# Patient Record
Sex: Male | Born: 1940 | Race: White | Hispanic: No | Smoking: Never smoker
Health system: Southern US, Community
[De-identification: ages and names within clinical notes are randomized; demographics above are authoritative.]

## PROBLEM LIST (undated history)

## (undated) DIAGNOSIS — I10 Essential (primary) hypertension: Secondary | ICD-10-CM

## (undated) HISTORY — PX: KNEE ARTHROSCOPY: SUR90

---

## 2002-06-12 ENCOUNTER — Emergency Department (HOSPITAL_COMMUNITY): Admission: EM | Admit: 2002-06-12 | Discharge: 2002-06-12 | Payer: Self-pay | Admitting: Emergency Medicine

## 2002-06-12 ENCOUNTER — Encounter: Payer: Self-pay | Admitting: Emergency Medicine

## 2002-07-19 ENCOUNTER — Inpatient Hospital Stay (HOSPITAL_COMMUNITY): Admission: RE | Admit: 2002-07-19 | Discharge: 2002-07-21 | Payer: Self-pay | Admitting: Internal Medicine

## 2002-07-20 ENCOUNTER — Encounter (INDEPENDENT_AMBULATORY_CARE_PROVIDER_SITE_OTHER): Payer: Self-pay | Admitting: Specialist

## 2002-07-21 ENCOUNTER — Encounter: Payer: Self-pay | Admitting: Internal Medicine

## 2005-04-25 ENCOUNTER — Emergency Department (HOSPITAL_COMMUNITY): Admission: EM | Admit: 2005-04-25 | Discharge: 2005-04-25 | Payer: Self-pay | Admitting: Emergency Medicine

## 2005-12-07 ENCOUNTER — Emergency Department (HOSPITAL_COMMUNITY): Admission: EM | Admit: 2005-12-07 | Discharge: 2005-12-07 | Payer: Self-pay | Admitting: Emergency Medicine

## 2006-06-12 ENCOUNTER — Emergency Department (HOSPITAL_COMMUNITY): Admission: EM | Admit: 2006-06-12 | Discharge: 2006-06-12 | Payer: Self-pay | Admitting: Emergency Medicine

## 2006-11-11 ENCOUNTER — Emergency Department (HOSPITAL_COMMUNITY): Admission: EM | Admit: 2006-11-11 | Discharge: 2006-11-11 | Payer: Self-pay | Admitting: Emergency Medicine

## 2006-11-16 ENCOUNTER — Ambulatory Visit (HOSPITAL_COMMUNITY): Admission: RE | Admit: 2006-11-16 | Discharge: 2006-11-16 | Payer: Self-pay | Admitting: Orthopaedic Surgery

## 2006-11-25 ENCOUNTER — Encounter (INDEPENDENT_AMBULATORY_CARE_PROVIDER_SITE_OTHER): Payer: Self-pay | Admitting: Specialist

## 2006-11-25 ENCOUNTER — Ambulatory Visit (HOSPITAL_COMMUNITY): Admission: RE | Admit: 2006-11-25 | Discharge: 2006-11-25 | Payer: Self-pay | Admitting: Orthopaedic Surgery

## 2007-11-09 ENCOUNTER — Emergency Department (HOSPITAL_COMMUNITY): Admission: EM | Admit: 2007-11-09 | Discharge: 2007-11-09 | Payer: Self-pay | Admitting: Emergency Medicine

## 2009-03-22 ENCOUNTER — Emergency Department (HOSPITAL_COMMUNITY): Admission: EM | Admit: 2009-03-22 | Discharge: 2009-03-22 | Payer: Self-pay | Admitting: Emergency Medicine

## 2009-08-24 ENCOUNTER — Emergency Department (HOSPITAL_COMMUNITY): Admission: EM | Admit: 2009-08-24 | Discharge: 2009-08-24 | Payer: Self-pay | Admitting: Emergency Medicine

## 2009-09-27 ENCOUNTER — Emergency Department (HOSPITAL_COMMUNITY): Admission: EM | Admit: 2009-09-27 | Discharge: 2009-09-27 | Payer: Self-pay | Admitting: Emergency Medicine

## 2009-10-10 ENCOUNTER — Emergency Department (HOSPITAL_COMMUNITY): Admission: EM | Admit: 2009-10-10 | Discharge: 2009-10-11 | Payer: Self-pay | Admitting: Emergency Medicine

## 2010-01-08 ENCOUNTER — Emergency Department (HOSPITAL_COMMUNITY): Admission: EM | Admit: 2010-01-08 | Discharge: 2010-01-08 | Payer: Self-pay | Admitting: Emergency Medicine

## 2010-09-07 ENCOUNTER — Encounter: Payer: Self-pay | Admitting: Orthopaedic Surgery

## 2010-10-12 ENCOUNTER — Emergency Department (HOSPITAL_COMMUNITY): Payer: Medicare (Managed Care)

## 2010-10-12 ENCOUNTER — Emergency Department (HOSPITAL_COMMUNITY)
Admission: EM | Admit: 2010-10-12 | Discharge: 2010-10-12 | Disposition: A | Discharge status: Home / Self Care | Payer: Medicare (Managed Care) | Attending: Emergency Medicine | Admitting: Emergency Medicine

## 2010-10-12 DIAGNOSIS — M25619 Stiffness of unspecified shoulder, not elsewhere classified: Secondary | ICD-10-CM | POA: Insufficient documentation

## 2010-10-12 DIAGNOSIS — M25519 Pain in unspecified shoulder: Secondary | ICD-10-CM | POA: Insufficient documentation

## 2010-10-12 DIAGNOSIS — Z79899 Other long term (current) drug therapy: Secondary | ICD-10-CM | POA: Insufficient documentation

## 2010-10-12 DIAGNOSIS — M19019 Primary osteoarthritis, unspecified shoulder: Secondary | ICD-10-CM | POA: Insufficient documentation

## 2010-10-12 DIAGNOSIS — I10 Essential (primary) hypertension: Secondary | ICD-10-CM | POA: Insufficient documentation

## 2010-10-12 LAB — BASIC METABOLIC PANEL
BUN: 22 mg/dL (ref 6–23)
CO2: 27 mEq/L (ref 19–32)
Chloride: 103 mEq/L (ref 96–112)
Creatinine, Ser: 1.62 mg/dL — ABNORMAL HIGH (ref 0.4–1.5)
Glucose, Bld: 96 mg/dL (ref 70–99)
Potassium: 3.8 mEq/L (ref 3.5–5.1)

## 2010-10-28 ENCOUNTER — Other Ambulatory Visit (HOSPITAL_COMMUNITY): Payer: Self-pay | Admitting: Internal Medicine

## 2010-10-28 DIAGNOSIS — M1990 Unspecified osteoarthritis, unspecified site: Secondary | ICD-10-CM

## 2010-10-30 ENCOUNTER — Other Ambulatory Visit (HOSPITAL_COMMUNITY): Payer: Medicare (Managed Care)

## 2010-11-05 LAB — DIFFERENTIAL
Basophils Absolute: 0 10*3/uL (ref 0.0–0.1)
Basophils Relative: 1 % (ref 0–1)
Lymphocytes Relative: 32 % (ref 12–46)
Monocytes Relative: 7 % (ref 3–12)
Neutro Abs: 2.1 10*3/uL (ref 1.7–7.7)
Neutrophils Relative %: 55 % (ref 43–77)

## 2010-11-05 LAB — CBC
HCT: 34.1 % — ABNORMAL LOW (ref 39.0–52.0)
Hemoglobin: 11.8 g/dL — ABNORMAL LOW (ref 13.0–17.0)
MCHC: 34.6 g/dL (ref 30.0–36.0)
MCV: 91.8 fL (ref 78.0–100.0)
RDW: 14 % (ref 11.5–15.5)

## 2010-11-05 LAB — COMPREHENSIVE METABOLIC PANEL
Alkaline Phosphatase: 38 U/L — ABNORMAL LOW (ref 39–117)
BUN: 15 mg/dL (ref 6–23)
Creatinine, Ser: 1.26 mg/dL (ref 0.4–1.5)
Glucose, Bld: 96 mg/dL (ref 70–99)
Potassium: 3.2 mEq/L — ABNORMAL LOW (ref 3.5–5.1)
Total Protein: 6.4 g/dL (ref 6.0–8.3)

## 2010-11-05 LAB — URINALYSIS, ROUTINE W REFLEX MICROSCOPIC
Bilirubin Urine: NEGATIVE
Glucose, UA: NEGATIVE mg/dL
Hgb urine dipstick: NEGATIVE
Ketones, ur: NEGATIVE mg/dL
Protein, ur: NEGATIVE mg/dL
pH: 5.5 (ref 5.0–8.0)

## 2010-11-10 ENCOUNTER — Ambulatory Visit (HOSPITAL_COMMUNITY)
Admission: RE | Admit: 2010-11-10 | Discharge: 2010-11-10 | Disposition: A | Discharge status: Home / Self Care | Payer: Medicare (Managed Care) | Source: Ambulatory Visit | Attending: Internal Medicine | Admitting: Internal Medicine

## 2010-11-10 ENCOUNTER — Encounter (HOSPITAL_COMMUNITY): Payer: Self-pay

## 2010-11-10 DIAGNOSIS — M899 Disorder of bone, unspecified: Secondary | ICD-10-CM | POA: Insufficient documentation

## 2010-11-10 DIAGNOSIS — M1990 Unspecified osteoarthritis, unspecified site: Secondary | ICD-10-CM

## 2010-11-19 ENCOUNTER — Other Ambulatory Visit (HOSPITAL_COMMUNITY): Payer: Self-pay | Admitting: Nephrology

## 2010-11-19 DIAGNOSIS — N289 Disorder of kidney and ureter, unspecified: Secondary | ICD-10-CM

## 2010-11-27 ENCOUNTER — Ambulatory Visit (HOSPITAL_COMMUNITY): Payer: Medicare (Managed Care)

## 2010-12-01 ENCOUNTER — Ambulatory Visit (HOSPITAL_COMMUNITY)
Admission: RE | Admit: 2010-12-01 | Discharge: 2010-12-01 | Disposition: A | Discharge status: Home / Self Care | Payer: Medicare (Managed Care) | Source: Ambulatory Visit | Attending: Nephrology | Admitting: Nephrology

## 2010-12-01 DIAGNOSIS — N289 Disorder of kidney and ureter, unspecified: Secondary | ICD-10-CM | POA: Insufficient documentation

## 2010-12-01 DIAGNOSIS — R9389 Abnormal findings on diagnostic imaging of other specified body structures: Secondary | ICD-10-CM | POA: Insufficient documentation

## 2010-12-01 DIAGNOSIS — I10 Essential (primary) hypertension: Secondary | ICD-10-CM | POA: Insufficient documentation

## 2011-01-02 NOTE — Op Note (Signed)
[name], [name]                         ACCOUNT NO.:  [id number]   MEDICAL RECORD NO.:  [id number]                   PATIENT TYPE:  INP   LOCATION:  5714                                 FACILITY:  [facility]   PHYSICIAN:  Petra Kuba, M.D.                 DATE OF BIRTH:  1941/03/27   DATE OF PROCEDURE:  07/20/2002  DATE OF DISCHARGE:                                 OPERATIVE REPORT   PROCEDURE PERFORMED:  Esophagogastroduodenoscopy with biopsy.   ENDOSCOPIST:  Petra Kuba, M.D.   INDICATIONS FOR PROCEDURE:  Gastrointestinal bleeding in a patient who is an  alcoholic on aspirin and nonsteroidals.  Consent was signed after the risks,  benefits, methods and options were thoroughly discussed with the patient.   MEDICINES USED:  Demerol 50 mg, Versed 5 mg.   DESCRIPTION OF PROCEDURE:  The video endoscope was inserted by direct  vision.  The esophagus was normal.  There were no obvious signs of varices  or  hiatal hernia or esophagitis.  Scope passed into the stomach and  advanced through a normal antrum and normal pylorus into a normal duodenal  bulb and around the C-loop to a normal second portion of the duodenum.  No  blood was seen so the scope was slowly withdrawn back to the bulb and a good  look there ruled out masses or abnormalities in that location.  Scope was  withdrawn back to the stomach and retroflexed.  The angularis, cardia,  fundus, lesser and greater curve were normal except for some moderate  gastritis.  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  The scope was  straightened.  Straight visualization in the stomach confirmed the above  findings without additional findings.  Two biopsies of the antrum and two of  the proximal stomach were obtained to confirm the gastropathy and rule out  Helicobacter pylori or other causes of his gastritis.  Air was suctioned,  scope was slowly withdrawn.  Again, a good look at the esophagus on slow  withdrawal was normal.  The  patient tolerated the procedure well.  There  were no obvious immediate complication.   ENDOSCOPIC DIAGNOSIS:  1. Gastropathy versus moderate gastritis status post biopsy.  2. No blood seen.  3. Otherwise normal esophagogastroduodenoscopy.   PLAN:  Will need a colon check at some point.  I have checked the schedule  for tomorrow but will have to do as an outpatient or early next week if  still here due to scheduling problems tomorrow.  We could go ahead and get  an ultrasound of his abdomen and his kidneys tomorrow.  No aspirin or  nonsteroidals, probably long term.  Up to four  Tylenol a day is okay with liver disease.  Will go ahead and put him on pump  inhibitors and slowly advance his diet.  I have discussed all of the above  with the wife.  She  will call me next week if he is discharged, for the  biopsy results and to set up a colon depending on what his BUN and  creatinine are, will decide on GoLYTELY versus Fleets prep.                                                Petra Kuba, M.D.    MEM/MEDQ  D:  07/20/2002  T:  07/20/2002  Job:  [id number]   cc:   Fleet Contras, M.D.  [address]  [place]  [place] [address]  Fax: [phone]

## 2011-01-02 NOTE — Op Note (Signed)
NAME:  Carl Howard, Carl Howard             ACCOUNT NO.:  [id number]   MEDICAL RECORD NO.:  [id number]          PATIENT TYPE:  AMB   LOCATION:  DAY                           FACILITY:  [facility]   PHYSICIAN:  J. Darreld Mclean, M.D. DATE OF BIRTH:  08-Mar-1941   DATE OF PROCEDURE:  11/25/2006  DATE OF DISCHARGE:                               OPERATIVE REPORT   PREOPERATIVE DIAGNOSIS:  Tear, medial meniscus, right knee.   POSTOPERATIVE DIAGNOSIS:  Tear, medial meniscus, right knee.   PROCEDURE:  Operative arthroscopy of the right knee, partial medial  meniscectomy using the laser.   ANESTHESIA:  General.   TOURNIQUET TIME:  19 minutes.   No drains.   INDICATIONS:  The patient is a 70 year old male with pain and tenderness  in his right knee with giving away. MRI shows tear of the posterior horn  of the medial meniscus. It was not improved by conservative treatment.  Surgery is recommended. Risks and imponderables were discussed  preoperatively with the patient and his family. They appeared to  understand and agreed with the procedure as outlined.   DESCRIPTION OF PROCEDURE:  The patient was seen in the holding area. The  right knee was identified as the correct surgical site. I placed a mark  on the right knee. He placed a mark on the right knee. He was brought  back to the operating room. He was given general anesthesia, placed  supine on the operating room table. Tourniquet placed deflated right  upper thigh. Leg holder placed right upper thigh. Prepped and draped in  the usual manner. We had a time out, identifying Mr. Dibello as the  patient and right knee as the correct surgical site.   Leg was elevated and wrapped circumferentially with Esmarch bandage,  knee prepped and draped. Tourniquet inflated to 300 mmHg. Esmarch  bandage removed. Inflow cannula inserted medially and lactated ringers  instilled in the knee by an infusion pump. Arthroscope was inserted  laterally. Knee was  systematically examined.   Prepatellar pouch just minimal changes. Undersurface of the patella  grade 2 changes. Medially, there was a tear in the posterior horn of the  medial meniscus with grade 2 changes medially. The articular surface of  the anterior cruciate was intact. Laterally, it looked very good with  only grade 2 changes. No loose bodies were present. Permanent pictures  were taken throughout the case.   __________  medial side and using a meniscal knife punch, meniscal  shaver and a razor, good smooth contour was obtained. Knee was  systematically reexamined and no new pathology. The wounds were  reapproximated using 3-0 nylon in an interrupted vertical mattress  manner. Marcaine 0.25% used in each portal. Tourniquet deflated after 19  minutes. Sterile dressing applied. Bulky dressing applied. He has a knee  immobilizer and will go back into this.  Physical therapy has been arranged for him as an outpatient. I will see  him in the office in approximately 10 days to 2 weeks. If any  difficulties, he is contact me through the office or hospital beeper  system. Numbers have been provided.  Prescription for Darvocet-N 100  given for pain.           ______________________________  J. Darreld Mclean, M.D.     JWK/MEDQ  D:  11/25/2006  T:  11/25/2006  Job:  [id number]

## 2011-01-02 NOTE — H&P (Signed)
NAME:  Carl Howard, Carl Howard             ACCOUNT NO.:  [id number]   MEDICAL RECORD NO.:  [id number]          PATIENT TYPE:  AMB   LOCATION:  DAY                           FACILITY:  [facility]   PHYSICIAN:  J. Darreld Mclean, M.D. DATE OF BIRTH:  20-Nov-1940   DATE OF ADMISSION:  DATE OF DISCHARGE:  LH                              HISTORY & PHYSICAL   CHIEF COMPLAINT:  Right knee hurts.   HISTORY OF PRESENT ILLNESS:  The patient is a 70 year old male with pain  and tenderness in his right knee.  This has been gradually getting worse  over the past several months.  He has had increasing pain, giving way  and swelling.  He has had no fever, chills or other joint pains.  He  went to the ER on the 27th of March.  X-rays were taken and were  negative.  He was put in a knee immobilizer.  I saw him in the office on  March 31 and I was concerned about a medial meniscal tear.  I  recommended an MRI of his knee.  He underwent an MRI at the hospital on  the 1st of April.  Findings were compatible with a tear of the posterior  horn of the medial meniscus with a knee joint effusion.  I informed the  patient of the findings.  His lateral meniscus and the anterior cruciate  and posterior cruciate were intact.  Surgery has been recommended.  The  patient agrees to the procedure.  Risks and imponderables of the  procedure have been discussed in detail.  He asked appropriate  questions.   PAST HISTORY:  Negative.  He denies heart disease, lung disease, kidney  disease, stroke, paralysis, weakness, hypertension, diabetes, TB,  rheumatic fever, cancer, polio, ulcer disease or circulatory problems.   ALLERGIES:  He denies any allergies.   MEDICATIONS:  He is taking Vicodin 5/500 and Naprosyn 500 p.o. b.i.d.   HABITS:  The patient does not smoke and does not use alcoholic  beverages.   PRIMARY CARE PHYSICIAN:  He does not list a family physician.   PAST SURGICAL HISTORY:  He has never had any surgery.   FAMILY HISTORY:  Diabetes and hypertension run in the family.   SOCIAL HISTORY:  The patient is married and lives in [place], [place].   PHYSICAL EXAMINATION:  VITAL SIGNS:  BP 142/84, pulse 82, respirations  16, afebrile, 5 feet 5 inches, 140 pounds.  GENERAL:  He is alert, cooperative and oriented.  HEENT:  Negative.  NECK:  Supple.  LUNGS:  Clear to P&A.  HEART:  Regular without murmur heard.  ABDOMEN:  Soft and nontender without masses.  EXTREMITIES:  Right knee with an effusion, pain and tenderness.  Range  of motion of the knee is 5-90 degrees with pain and tenderness medially.  He has got a positive McMurray medially.  Drawer signs are negative.  Lachman's negative.  Other extremities are within normal limits.  CNS:  Intact.  SKIN:  Intact.   IMPRESSION:  Tear of the medial meniscus of the right knee.   PLAN:  Arthroscopy of  the knee.  I discussed with the patient the  planned procedure, risks and imponderables.  He appears to understand.  Labs are pending.  I plan to do the surgery on the 10th of April.                                            ______________________________  Shela Commons. Darreld Mclean, M.D.     JWK/MEDQ  D:  11/21/2006  T:  11/22/2006  Job:  [id number]

## 2011-01-02 NOTE — H&P (Signed)
Carl Howard, Carl Howard                         ACCOUNT NO.:  [id number]   MEDICAL RECORD NO.:  [id number]                   PATIENT TYPE:  INP   LOCATION:  5714                                 FACILITY:  [facility]   PHYSICIAN:  Fleet Contras, M.D.                 DATE OF BIRTH:  [birth year]   DATE OF ADMISSION:  07/19/2002  DATE OF DISCHARGE:                                HISTORY & PHYSICAL   PRESENTING COMPLAINTS:  Bruising over the anterior abdomen and chest wall of  five days' duration.   HISTORY OF PRESENTING COMPLAINTS:  The patient is a 70 year old Caucasian  gentleman who was seen in the office of [facility] today with a  five-day history of progressive bruising and skin discoloration over the  anterior abdomen and upper chest wall.  He noted this one morning when he  checked his abdomen and noticed the skin change.  He denied any bleeding  from the nose, mouth, but noted that his stools were getting dark; he had  also, in the last two days, been having nausea and vomited some Kool-Aid-  colored materials.  He and his wife admit that he has been taking Tylenol,  Aleve and ibuprofen regularly on a daily basis for pain.  He denies any  fevers or chills.  He has had no cough or hemoptysis.   PAST MEDICAL HISTORY:  1. Alcoholic liver disease.  2. Edema of the legs.  3. Anemia of iron deficiency.   PAST SURGICAL HISTORY:  None significant.   CURRENT MEDICATIONS:  1. Lasix 40 mg once a day.  2. Spironolactone 25 mg once a day.  3. Vitamin B6 100 mg one every day.  4. Folic acid 1 mg every day.  5. Iron sulfate 325 mg one every day.   ALLERGIES:  He has no known drug allergies.   FAMILY AND SOCIAL HISTORY:  He is married and currently lives with his wife.  He denies tobacco smoking but chews tobacco.  He drank beer daily until  about three weeks ago and he claims to have been sober since then.  His  father died of emphysema.  His mom is alive and suffers from  hypertension  and diabetes.  He has four brothers and four sisters, all of whom are alive  and well.  He has seven children also in good health.   REVIEW OF SYSTEMS:  GENERAL:  He does have some fatigue and weakness but he  denies any fevers, chills or sweating.  RESPIRATORY:  He has no cough,  sputum production, hemoptysis or wheezing.  CARDIAC:  He denies exertional  chest pain, dyspnea, palpitations, orthopnea or PND.  GASTROINTESTINAL:  He  has had no abdominal pain, heartburn, diarrhea or constipation.  He did say  he had some nausea and vomiting of coffee-grounds material a well as passage  of dark stools.  GENITOURINARY:  He denies any  frequency, dysuria, nocturia,  hematuria, hesitancy or incontinence.  HEENT:  He denies any blurring of  vision, eye pain, redness or diplopia.  He has had no sore throats or dental  problems.  NEUROLOGIC:  He denies any dizziness, syncope, seizures, focal  weakness or numbness.  He has had no anxiety, depression, insomnia, memory  loss or difficulty concentrating.   PHYSICAL EXAMINATION:  VITAL SIGNS:  Vital signs show blood pressure of  140/70, height 5 feet 4 inches and a weight of 121 pounds, temperature is  96.7 degrees Fahrenheit, heart rate is 84 per minute, regular, respiratory  rate is 20 per minute.  GENERAL:  He appears chronically ill, not in acute respiratory or painful  distress.  He is not anxious or depressed.  He does not appear to be in any  pain and he is not short of breath.  SKIN:  His skin shows diffuse bruising of the anterior upper abdomen and  lower chest wall extending up to the nipples, especially on the left side.  These are completely nontender and there is no evidence of subcutaneous  emphysema.  HEENT:  He is normocephalic and atraumatic.  Normal lips, oropharyngeal  mucosa.  NECK:  Neck is supple with no evidence of JVD.  Carotid pulses are 2+  bilaterally with no bruit. No thyroid masses or tenderness.  HEART:   Heart sounds 1 and 2 are heard with no murmurs.  Rhythm is regular.  No gallops.  No clicks.  No rub.  LUNGS:  Lungs show good air entry bilaterally with normal breath sounds.  ABDOMEN:  Abdomen is soft, nontender.  There is mild distention with mild  shifting dullness.  Bowel sounds are present.  RECTAL:  Exam shows no masses.  There are hemorrhoids with positive stool  guaiac; there is no gross blood.  Prostate is not enlarged, with no masses  and nontender.  EXTREMITIES:  Extremities show no edema but he has bilateral varicosities  and stasis changes.  No clubbing or cyanosis.  NEUROLOGIC:  He is alert and oriented x3.  There are no focal neurologic  deficits and he has no asterixis.  PERIPHERAL PULSES:  Peripheral pulses are normal and full with no bruits.   LABORATORY DATA:  Laboratory data performed in the hospital shows white cell  count of 15.2, hemoglobin 7.5, hematocrit 22.4, platelet count 375,000.  PT  is 14.7 with INR of 1.1.  PTT is 34.  Sodium is 135, potassium 3.4, chloride  101, bicarbonate of 23, BUN 47, creatinine is 3.4, glucose of 121, alkaline  phosphatase is 171, AST 27, ALT 16, total bilirubin is 1.8 and albumin is  2.3.   ASSESSMENT:  The patient is a 70 year old Caucasian gentleman seen in the  office of [facility] today with five-day history of progressive  bruising over the anterior abdomen and chest wall.  On evaluation today, he  does have bruising and ecchymoses over the anterior abdomen and chest wall.  He is not in any acute respiratory or painful distress.  Initial laboratory  data shows severe anemia with some elevation of prothrombin and partial  thromboplastin time and also elevation of BUN and creatinine, as well as  alkaline phosphatase and bilirubin.  The patient has admitted to a medical  bed at [facility] for further evaluation.   ADMITTING DIAGNOSES:  1. Coagulation disorder. 2. Gastritis with gastrointestinal  hemorrhage.  3. Chronic liver disease.  4. Hypoalbuminemia.  5. Renal insufficiency.  6. Severe  iron deficiency anemia.   PLAN OF CARE:  The patient will be admitted to a medical bed.  He will be  kept n.p.o. until gastroenterology evaluation.  He will be placed on  bedrest, vital signs q.4h., input/output monitoring, type and cross-match  two units of packed red blood cells for transfusion.  Consult will be  requested from [facility].  Aldactone and Lasix will be put on  hold.  He will be started on intravenous D-5 half normal saline at 25 cc/hr.  The patient likely  has a coagulation disorder secondary to alcoholic liver disease and now has  bruising and ecchymoses of the skin.  He has renal insufficiency most likely  due to azotemia secondary to diuretic therapy.  The patient will be  monitored on the above treatment plan and this will be adjusted according to  his response and results of further investigation.                                               Fleet Contras, M.D.    EA/MEDQ  D:  07/20/2002  T:  07/20/2002  Job:  [id number]

## 2011-01-02 NOTE — Consult Note (Signed)
Carl Howard, RUSHING NO.:  [id number]   MEDICAL RECORD NO.:  [id number]                   PATIENT TYPE:  INP   LOCATION:  5714                                 FACILITY:  [facility]   PHYSICIAN:  Petra Kuba, M.D.                 DATE OF BIRTH:  1941/03/25   DATE OF CONSULTATION:  07/20/2002  DATE OF DISCHARGE:                                   CONSULTATION   HISTORY OF PRESENT ILLNESS:  The patient is seen at the request of Dr.  Concepcion Elk for guaiac-positive anemia.  He threw up some Kool-Aid which was  questionably blood.  Has a long history of alcohol, and some aspirin and  Goody's abuse, although he himself denies any obvious bowel or stomach  problems or complaints.  He has not had any previous obvious recent GI  workup, although might of had a flexible sigmoidoscopy and endoscopy years  ago.  Has not had an ultrasound or a CAT scan.   PAST MEDICAL HISTORY:  1. Alcoholic liver disease.  2. Iron-deficiency anemia.  3. Recent arm fracture.   PAST SURGICAL HISTORY:  Boil being removed.   MEDICATIONS:  1. Lasix.  2. Aldactone.  3. Vitamin B6.  4. Folic acid.  5. Iron.   ALLERGIES:  No known drug allergies.   FAMILY HISTORY:  Negative for any obvious GI problems in the family, ulcers,  colon cancer, etc.   REVIEW OF SYMPTOMS:  Pertinent for some fatigue and weakness, but otherwise  negative.   PHYSICAL EXAMINATION:  GENERAL:  In no acute distress, sitting comfortably  in the bed.  VITAL SIGNS:  Stable, afebrile.  HEENT:  Sclerae not obviously icteric.  NECK:  Supple without obvious adenopathy.  LUNGS:  Clear.  HEART:  Regular rate and rhythm.  ABDOMEN:  Soft, possibly I am feeling a spleen edge, no obvious  hepatomegaly, nontender.   LABORATORY DATA:  Hemoglobin of 7.5, MCV 102, platelet count 375.  White  blood cell count 15.2.  Post-transfusion hemoglobin is 10.8.  PT 14.7, PTT  34.  BUN 47, creatinine 3.4, which dropped to 37 and  2.4 with some  hydration.  Bilirubin of 1.8, alkaline phosphatase 171.  SGOT and SGPT  normal.  Albumin 2.3.  Chest x-ray negative.   ASSESSMENT:  1. Alcoholic probable liver disease.  2. Guaiac-positive anemia.  3. Questionable renal insufficiency versus dehydration.   PLAN:  Risks, benefits, and methods of endoscopy were discussed, and we will  proceed today.  He will need an abdominal ultrasound to check his kidneys  and his liver.  He will need alcohol rehab, although he says he quit.  For  further workup and plans, please see endoscopy dictation.  Petra Kuba, M.D.    MEM/MEDQ  D:  07/20/2002  T:  07/20/2002  Job:  [id number]   cc:   Fleet Contras, M.D.  [address]  [place]  [place] [address]  Fax: [phone]
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
## 2012-10-25 ENCOUNTER — Emergency Department (HOSPITAL_COMMUNITY)
Admission: EM | Admit: 2012-10-25 | Discharge: 2012-10-25 | Disposition: A | Discharge status: Home / Self Care | Payer: PRIVATE HEALTH INSURANCE | Attending: Emergency Medicine | Admitting: Emergency Medicine

## 2012-10-25 ENCOUNTER — Encounter (HOSPITAL_COMMUNITY): Payer: Self-pay | Admitting: *Deleted

## 2012-10-25 DIAGNOSIS — X500XXA Overexertion from strenuous movement or load, initial encounter: Secondary | ICD-10-CM | POA: Insufficient documentation

## 2012-10-25 DIAGNOSIS — IMO0002 Reserved for concepts with insufficient information to code with codable children: Secondary | ICD-10-CM | POA: Insufficient documentation

## 2012-10-25 DIAGNOSIS — Z9889 Other specified postprocedural states: Secondary | ICD-10-CM | POA: Insufficient documentation

## 2012-10-25 DIAGNOSIS — Y929 Unspecified place or not applicable: Secondary | ICD-10-CM | POA: Insufficient documentation

## 2012-10-25 DIAGNOSIS — Z8739 Personal history of other diseases of the musculoskeletal system and connective tissue: Secondary | ICD-10-CM | POA: Insufficient documentation

## 2012-10-25 DIAGNOSIS — I10 Essential (primary) hypertension: Secondary | ICD-10-CM | POA: Insufficient documentation

## 2012-10-25 DIAGNOSIS — S86911A Strain of unspecified muscle(s) and tendon(s) at lower leg level, right leg, initial encounter: Secondary | ICD-10-CM

## 2012-10-25 DIAGNOSIS — Y9389 Activity, other specified: Secondary | ICD-10-CM | POA: Insufficient documentation

## 2012-10-25 HISTORY — DX: Essential (primary) hypertension: I10

## 2012-10-25 MED ORDER — HYDROCODONE-ACETAMINOPHEN 5-325 MG PO TABS
1.0000 | ORAL_TABLET | Freq: Once | ORAL | Status: AC
  Administered 2012-10-25: 1 via ORAL
  Filled 2012-10-25: qty 1

## 2012-10-25 MED ORDER — HYDROCODONE-ACETAMINOPHEN 5-325 MG PO TABS: 1.0000 | ORAL_TABLET | ORAL | Status: DC | PRN

## 2012-10-25 MED ORDER — PREDNISONE 10 MG PO TABS: 20.0000 mg | ORAL_TABLET | Freq: Every day | ORAL | Status: DC

## 2012-10-25 NOTE — ED Notes (Signed)
Patient with no complaints at this time. Respirations even and unlabored. Skin warm/dry. Discharge instructions reviewed with patient at this time. Patient given opportunity to voice concerns/ask questions. Patient discharged at this time and left Emergency Department with steady gait.   

## 2012-10-25 NOTE — ED Provider Notes (Signed)
History     CSN: [id number]  Arrival date & time 10/25/12  1122   First MD Initiated Contact with Patient 10/25/12 1205      Chief Complaint  Patient presents with  . Knee Pain    (Consider location/radiation/quality/duration/timing/severity/associated sxs/prior treatment) Patient is a 72 y.o. male presenting with knee pain. The history is provided by the patient.  Knee Pain Location:  Knee Time since incident:  1 day Knee location:  R knee Pain details:    Quality:  Shooting   Radiates to:  Does not radiate   Severity:  Moderate   Onset quality:  Sudden   Timing:  Constant   Progression:  Unchanged Chronicity:  Chronic Dislocation: no   Foreign body present:  No foreign bodies Prior injury to area:  Yes Relieved by:  Nothing Worsened by:  Bearing weight Ineffective treatments:  None tried Associated symptoms: decreased ROM   Associated symptoms: no back pain, no neck pain, no numbness and no tingling   Risk factors comment:  Known arthritis   Past Medical History  Diagnosis Date  . Hypertension     Past Surgical History  Procedure Laterality Date  . Knee arthroscopy      History reviewed. No pertinent family history.  History  Substance Use Topics  . Smoking status: Never Smoker   . Smokeless tobacco: Not on file  . Alcohol Use: No      Review of Systems  Constitutional: Negative for activity change.       All ROS Neg except as noted in HPI  HENT: Negative for nosebleeds and neck pain.   Eyes: Negative for photophobia and discharge.  Respiratory: Negative for cough, shortness of breath and wheezing.   Cardiovascular: Negative for chest pain and palpitations.  Gastrointestinal: Negative for abdominal pain and blood in stool.  Genitourinary: Negative for dysuria, frequency and hematuria.  Musculoskeletal: Positive for arthralgias. Negative for back pain.  Skin: Negative.   Neurological: Negative for dizziness, seizures and speech difficulty.   Psychiatric/Behavioral: Negative for hallucinations and confusion.    Allergies  Review of patient's allergies indicates no known allergies.  Home Medications  No current outpatient prescriptions on file.  BP 177/91  Pulse 87  Temp(Src) 97.4 F (36.3 C) (Oral)  Resp 18  Ht 5\' 5"  (1.651 m)  Wt 135 lb (61.236 kg)  BMI 22.47 kg/m2  SpO2 99%  Physical Exam  Nursing note and vitals reviewed. Constitutional: He is oriented to person, place, and time. He appears well-developed and well-nourished.  Non-toxic appearance.  HENT:  Head: Normocephalic.  Right Ear: Tympanic membrane and external ear normal.  Left Ear: Tympanic membrane and external ear normal.  Eyes: EOM and lids are normal. Pupils are equal, round, and reactive to light.  Neck: Normal range of motion. Neck supple. Carotid bruit is not present.  Cardiovascular: Normal rate, regular rhythm, normal heart sounds, intact distal pulses and normal pulses.   Pulmonary/Chest: Breath sounds normal. No respiratory distress.  Abdominal: Soft. Bowel sounds are normal. There is no tenderness. There is no guarding.  Musculoskeletal: Normal range of motion.  Soreness to palpation of the anterior medial right knee. No effusion. Patella midline. No posterior swelling or pain. No quad area deformity. Achilles intact on the right.  Lymphadenopathy:       Head (right side): No submandibular adenopathy present.       Head (left side): No submandibular adenopathy present.    He has no cervical adenopathy.  Neurological: He is  alert and oriented to person, place, and time. He has normal strength. No cranial nerve deficit or sensory deficit.  Skin: Skin is warm and dry.  Psychiatric: He has a normal mood and affect. His speech is normal.    ED Course  Procedures (including critical care time)  Labs Reviewed - No data to display No results found.   No diagnosis found.    MDM  I have reviewed nursing notes, vital signs, and all  appropriate lab and imaging results for this patient. Pt twisted wrong and injured the right knee. Pt has djd by hx. No acute changes on exam at this time. Plan  - will use prednisone and norco.       Kathie Dike, PA-C 10/25/12 1219

## 2012-10-25 NOTE — ED Notes (Signed)
Pt twisting right knee while getting out of bed this morning, c/o pain to knee

## 2012-10-25 NOTE — ED Notes (Signed)
Beverely Pace PA at bedside.

## 2012-10-26 NOTE — ED Provider Notes (Signed)
Medical screening examination/treatment/procedure(s) were conducted as a shared visit with non-physician practitioner(s) and myself.  I personally evaluated the patient during the encounter  Pt well appearing, no distress.  No bony injury suspected  Joya Gaskins, MD 10/26/12 [phone]

## 2013-09-25 ENCOUNTER — Emergency Department (HOSPITAL_COMMUNITY)
Admission: EM | Admit: 2013-09-25 | Discharge: 2013-09-26 | Disposition: A | Discharge status: Home / Self Care | Payer: PRIVATE HEALTH INSURANCE | Attending: Emergency Medicine | Admitting: Emergency Medicine

## 2013-09-25 ENCOUNTER — Emergency Department (HOSPITAL_COMMUNITY): Payer: PRIVATE HEALTH INSURANCE

## 2013-09-25 ENCOUNTER — Encounter (HOSPITAL_COMMUNITY): Payer: Self-pay | Admitting: Emergency Medicine

## 2013-09-25 DIAGNOSIS — Z87828 Personal history of other (healed) physical injury and trauma: Secondary | ICD-10-CM | POA: Insufficient documentation

## 2013-09-25 DIAGNOSIS — IMO0002 Reserved for concepts with insufficient information to code with codable children: Secondary | ICD-10-CM | POA: Insufficient documentation

## 2013-09-25 DIAGNOSIS — IMO0001 Reserved for inherently not codable concepts without codable children: Secondary | ICD-10-CM | POA: Insufficient documentation

## 2013-09-25 DIAGNOSIS — I10 Essential (primary) hypertension: Secondary | ICD-10-CM | POA: Insufficient documentation

## 2013-09-25 DIAGNOSIS — M5416 Radiculopathy, lumbar region: Secondary | ICD-10-CM

## 2013-09-25 MED ORDER — TRAMADOL HCL 50 MG PO TABS
50.0000 mg | ORAL_TABLET | Freq: Once | ORAL | Status: AC
  Administered 2013-09-25: 50 mg via ORAL
  Filled 2013-09-25: qty 1

## 2013-09-25 MED ORDER — METHOCARBAMOL 500 MG PO TABS
500.0000 mg | ORAL_TABLET | Freq: Once | ORAL | Status: AC
  Administered 2013-09-25: 500 mg via ORAL
  Filled 2013-09-25: qty 1

## 2013-09-25 NOTE — ED Notes (Signed)
Pain rt lower back , no known injury.  With pain into rt leg.

## 2013-09-25 NOTE — ED Provider Notes (Signed)
CSN: [id number]     Arrival date & time 09/25/13  2007 History  This chart was scribed for Loren Racer, MD by Ardelia Mems, ED Scribe. This patient was seen in room [facility] and the patient's care was started at 11:29 PM.   Chief Complaint  Patient presents with  . Back Pain    Patient is a 73 y.o. male presenting with back pain. The history is provided by the patient. No language interpreter was used.  Back Pain Location:  Lumbar spine (right-sided) Quality:  Unable to specify Radiates to:  R knee Pain severity:  Moderate Onset quality:  Gradual Duration:  6 days Timing:  Constant Progression:  Waxing and waning Chronicity:  Recurrent (similar pain in 2012) Context: not recent injury   Relieved by:  None tried Worsened by:  Nothing tried Ineffective treatments:  OTC medications (Tylenol) Associated symptoms: no abdominal pain, no bladder incontinence, no bowel incontinence, no fever, no numbness and no weakness     HPI Comments: Carl Howard is a 73 y.o. male who presents to the Emergency Department complaining of right-sided lower back that radiates to his right knee pain onset 6 days ago. Pt reports that he may have twisted his back, but he denies any specific injury or falls to have onset this pain. Pt states that he has been taking Tylenol at home with minimal relief of this pain. He states that he has a history of similar pain in 2012 after a fall, although he states at that time the pain did not radiate down his leg. He states that the prior pain resolved. Pt denies fever, chills, numbness, bowel or bladder incontinence or any other symptoms. Pt has a history of knee arthroscopy surgery. Pt denies any known medication allergies.   Past Medical History  Diagnosis Date  . Hypertension    Past Surgical History  Procedure Laterality Date  . Knee arthroscopy     History reviewed. No pertinent family history. History  Substance Use Topics  . Smoking status: Never  Smoker   . Smokeless tobacco: Not on file  . Alcohol Use: No    Review of Systems  Constitutional: Negative for fever and chills.  Gastrointestinal: Negative for nausea, vomiting, abdominal pain and bowel incontinence.  Genitourinary: Negative for bladder incontinence, hematuria, flank pain and difficulty urinating.  Musculoskeletal: Positive for back pain and myalgias.  Skin: Negative for rash and wound.  Neurological: Negative for weakness and numbness.  All other systems reviewed and are negative.   Allergies  Review of patient's allergies indicates no known allergies.  Home Medications  No current outpatient prescriptions on file.  Triage Vitals: BP 190/96  Pulse 97  Temp(Src) 98.2 F (36.8 C) (Oral)  Resp 20  Ht 5\' 5"  (1.651 m)  Wt 125 lb 14.4 oz (57.108 kg)  BMI 20.95 kg/m2  SpO2 100%  Physical Exam  Nursing note and vitals reviewed. Constitutional: He is oriented to person, place, and time. He appears well-developed and well-nourished. No distress.  HENT:  Head: Normocephalic and atraumatic.  Mouth/Throat: Oropharynx is clear and moist.  Eyes: EOM are normal. Pupils are equal, round, and reactive to light.  Neck: Normal range of motion. Neck supple. No tracheal deviation present.  Cardiovascular: Normal rate and regular rhythm.   Pulmonary/Chest: Effort normal and breath sounds normal. No respiratory distress. He has no wheezes. He has no rales. He exhibits no tenderness.  Abdominal: Soft. Bowel sounds are normal. He exhibits no distension and no mass. There  is no tenderness. There is no rebound and no guarding.  Musculoskeletal: Normal range of motion. He exhibits tenderness (tenderness to palpation over the right lumbar paraspinal muscles into the right buttock area. Patient has no midline thoracic or lumbar tenderness to palpation). He exhibits no edema.  Negative straight leg raise. Calves are soft with no tenderness. 2+ dorsalis pedis pulses bilaterally.   Neurological: He is alert and oriented to person, place, and time.  Patient is alert and oriented x3 with clear, goal oriented speech. Patient has 5/5 motor in all extremities. Sensation is intact to light touch. No saddle anesthesia Patient walks without assistance.   Skin: Skin is warm and dry. No rash noted. No erythema.  Psychiatric: He has a normal mood and affect. His behavior is normal.    ED Course  Procedures (including critical care time)  DIAGNOSTIC STUDIES: Oxygen Saturation is 100% on RA, normal by my interpretation.    COORDINATION OF CARE: 11:33 PM- Discussed plan for pt to receive medication in the ED. Will also obtain imaging of pt's back. Pt advised of plan for treatment and pt agrees.  Medications  methocarbamol (ROBAXIN) tablet 500 mg (500 mg Oral Given 09/25/13 2336)  traMADol (ULTRAM) tablet 50 mg (50 mg Oral Given 09/25/13 2336)   Labs Review Labs Reviewed - No data to display Imaging Review No results found.  EKG Interpretation   None       MDM   Final diagnoses:  None   I personally performed the services described in this documentation, which was scribed in my presence. The recorded information has been reviewed and is accurate.  Suspect lumbar radiculopathy. Patient has no red flag signs for cauda equina or infectious etiology. Will obtain plain x-ray to rule out any obvious fractures. Patient to be treated with pain control and advised follow up with his primary Dr. Return precautions have been given.  Loren Raceravid Sreekar Broyhill, MD 09/26/13 [phone]

## 2013-09-26 MED ORDER — METHOCARBAMOL 500 MG PO TABS: 500.0000 mg | ORAL_TABLET | Freq: Two times a day (BID) | ORAL | Status: AC | PRN

## 2013-09-26 MED ORDER — TRAMADOL HCL 50 MG PO TABS: 50.0000 mg | ORAL_TABLET | Freq: Four times a day (QID) | ORAL | Status: AC | PRN

## 2013-09-26 NOTE — Discharge Instructions (Signed)

## 2014-08-15 IMAGING — CR DG LUMBAR SPINE 2-3V
3 series · 3 of 3 positions shown · non-contrast
Comparison: None.

CLINICAL DATA: Low back pain for 1 week.

EXAM:
LUMBAR SPINE - 2-3 VIEW

[view not recorded (1 of 3)]
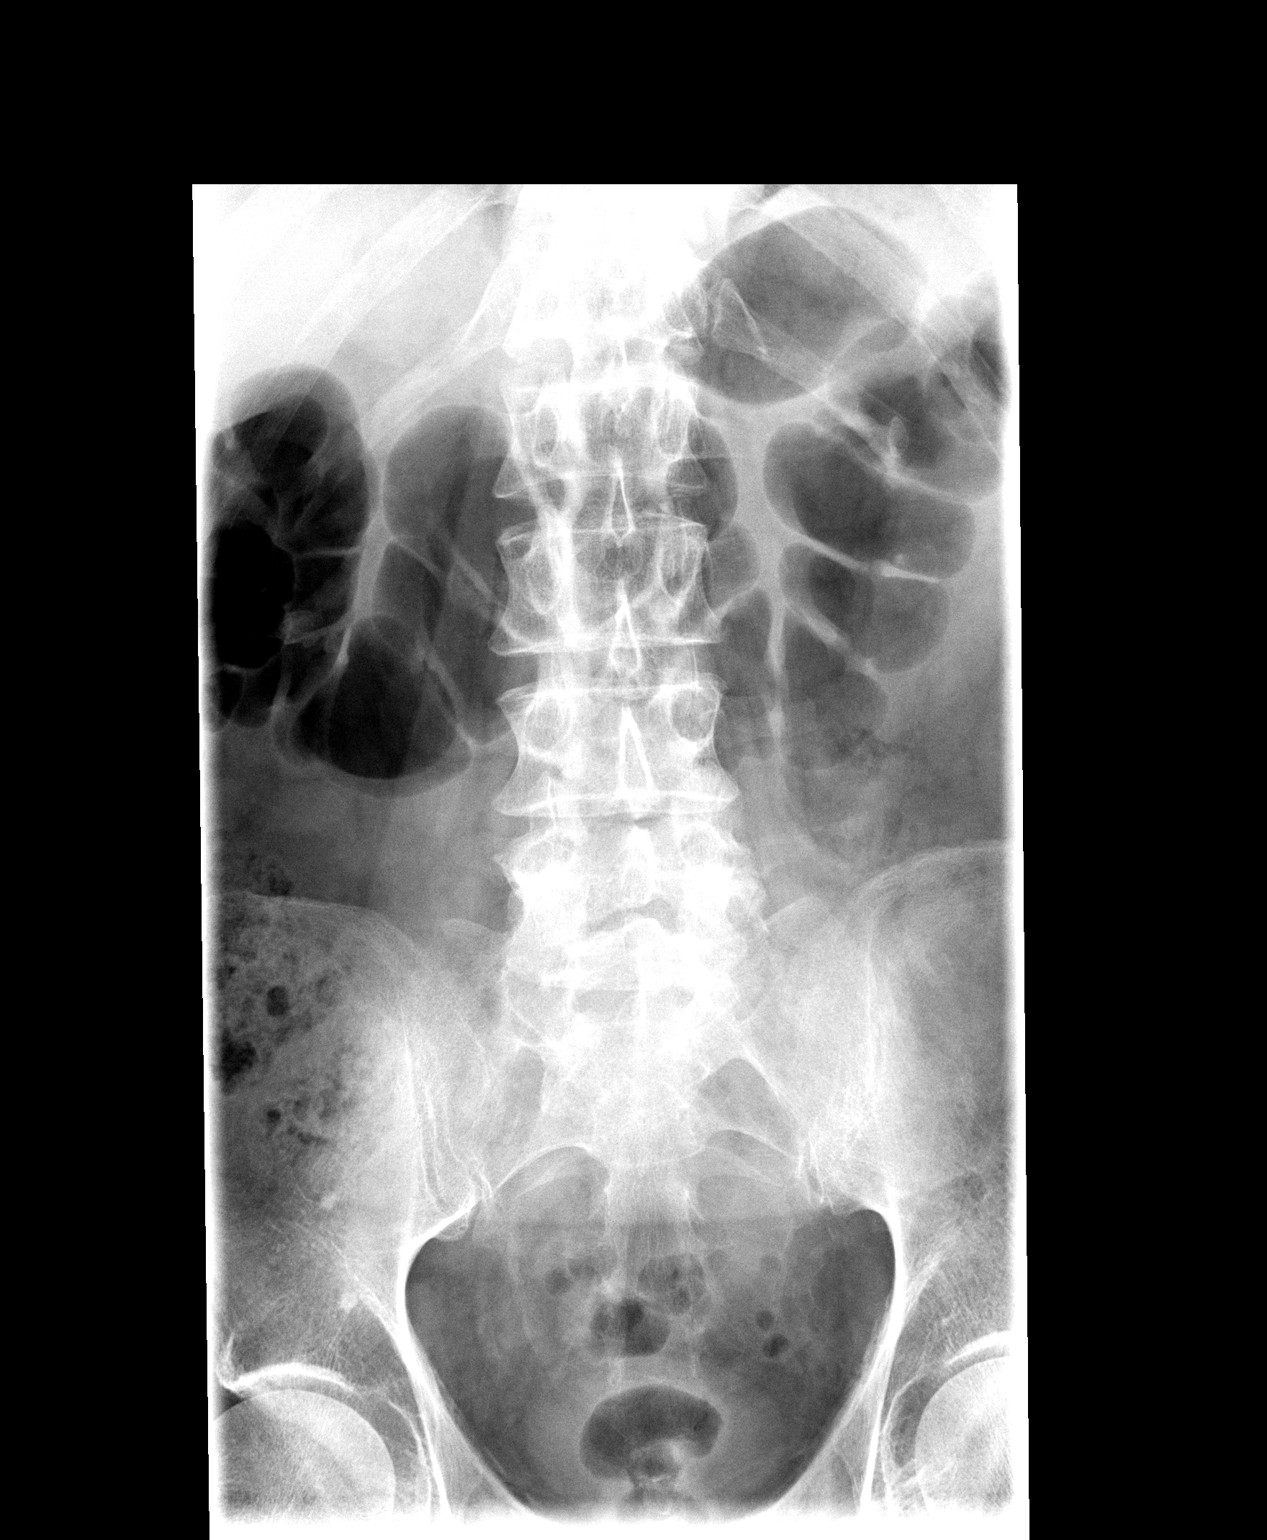

[view not recorded (2 of 3)]
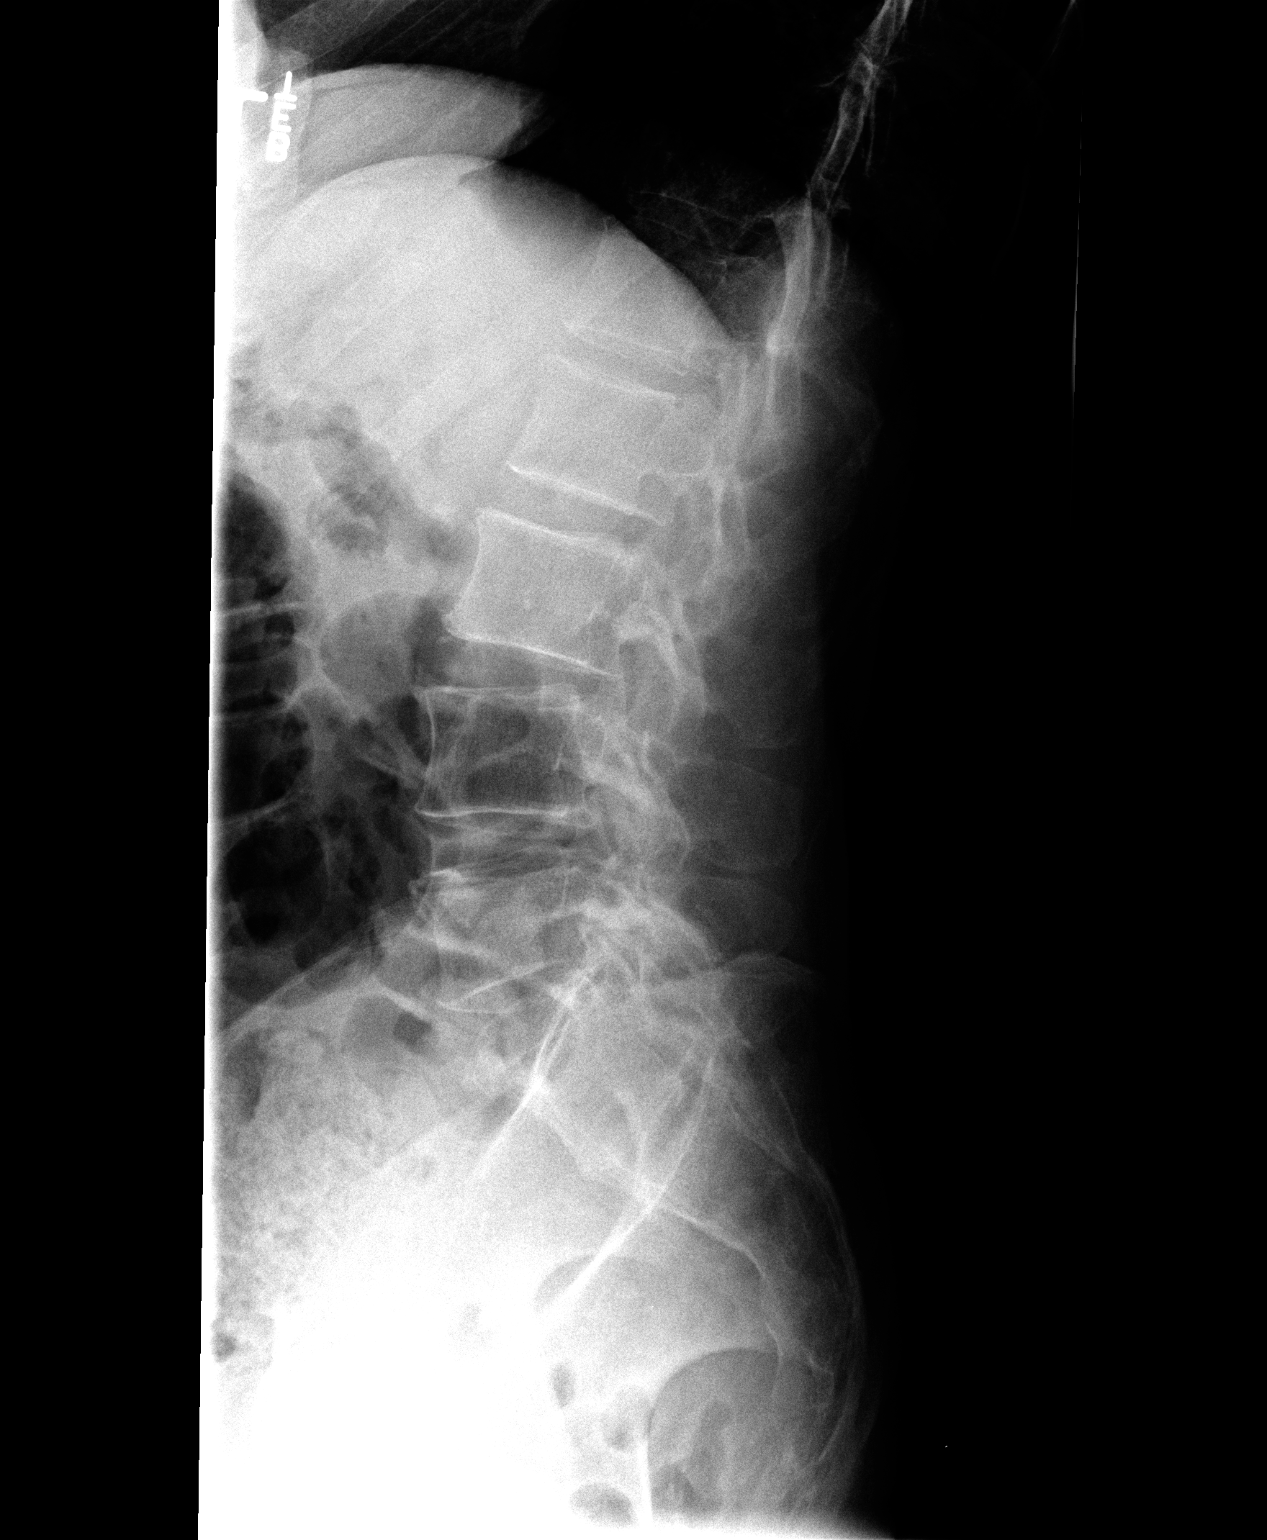

[view not recorded (3 of 3)]
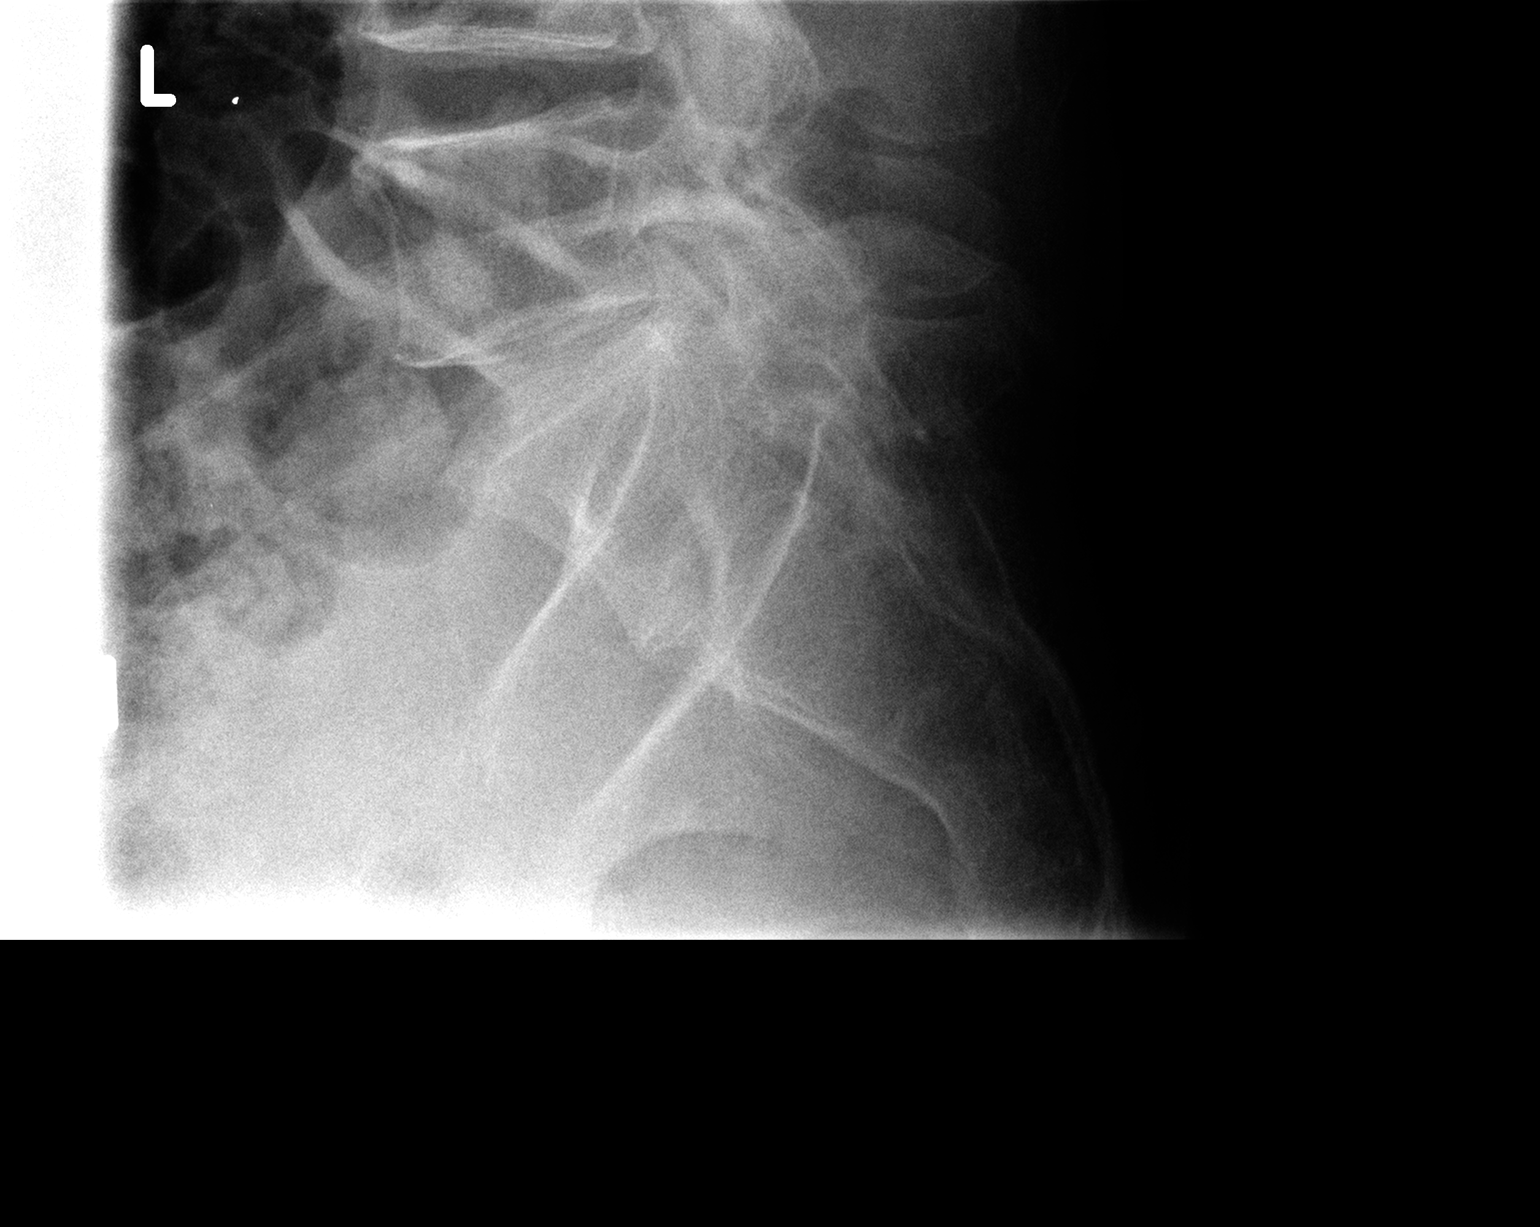

[3 of 3 positions shown; findings below may reference images not displayed]

FINDINGS: There are 4 lumbar type vertebral bodies. Lumbosacral transitional
vertebra appears fused to the sacrum. Mild dextroconvex curve of the
thoracolumbar spine with the apex at T12-L1. Vertebral body height
is preserved. No spondylolisthesis. Spinal alignment appears
anatomic on the lateral view allowing for obliquity.
IMPRESSION: No acute abnormality.  Lumbosacral transitional anatomy.
